# Patient Record
Sex: Female | Born: 1985 | Race: White | Hispanic: No | Marital: Married | Smoking: Former smoker
Health system: Southern US, Community
[De-identification: ages and names within clinical notes are randomized; demographics above are authoritative.]

## PROBLEM LIST (undated history)

## (undated) DIAGNOSIS — E119 Type 2 diabetes mellitus without complications: Secondary | ICD-10-CM

---

## 2006-09-25 ENCOUNTER — Ambulatory Visit (HOSPITAL_COMMUNITY): Admission: RE | Admit: 2006-09-25 | Discharge: 2006-09-25 | Payer: Self-pay | Admitting: Family Medicine

## 2009-04-24 ENCOUNTER — Encounter (HOSPITAL_COMMUNITY): Admission: RE | Admit: 2009-04-24 | Discharge: 2009-05-24 | Payer: Self-pay | Admitting: Pediatrics

## 2010-06-30 ENCOUNTER — Encounter: Payer: Self-pay | Admitting: Pediatrics

## 2016-09-24 ENCOUNTER — Ambulatory Visit: Payer: Self-pay | Admitting: "Endocrinology

## 2016-09-24 ENCOUNTER — Encounter: Payer: Self-pay | Admitting: "Endocrinology

## 2019-01-07 ENCOUNTER — Other Ambulatory Visit: Payer: Self-pay

## 2019-01-07 DIAGNOSIS — Z20822 Contact with and (suspected) exposure to covid-19: Secondary | ICD-10-CM

## 2019-01-09 LAB — NOVEL CORONAVIRUS, NAA: SARS-CoV-2, NAA: DETECTED — AB

## 2021-03-19 ENCOUNTER — Encounter: Payer: Self-pay | Admitting: Emergency Medicine

## 2021-03-19 ENCOUNTER — Other Ambulatory Visit: Payer: Self-pay

## 2021-03-19 ENCOUNTER — Ambulatory Visit (INDEPENDENT_AMBULATORY_CARE_PROVIDER_SITE_OTHER): Payer: Self-pay | Admitting: Emergency Medicine

## 2021-03-19 ENCOUNTER — Telehealth: Payer: Self-pay | Admitting: Emergency Medicine

## 2021-03-19 VITALS — BP 116/74 | HR 92 | Temp 98.4°F | Ht 65.0 in | Wt 228.4 lb

## 2021-03-19 DIAGNOSIS — R918 Other nonspecific abnormal finding of lung field: Secondary | ICD-10-CM

## 2021-03-19 DIAGNOSIS — K769 Liver disease, unspecified: Secondary | ICD-10-CM | POA: Insufficient documentation

## 2021-03-19 NOTE — Telephone Encounter (Signed)
Looks like it is actually at [facility], which works better for me

## 2021-03-19 NOTE — Progress Notes (Signed)
Subjective:    Patient ID: Katherine Washington, female    DOB: [birth year], 35 y.o.   MRN: [id number]  HPI 35 year old former smoker (3-4 pack years) with a history of diabetes.  She has been dealing with right-sided dental pain and left facial pain in mid September and was started on Augmentin for possible dental abscess.  She involves some dysphagia, difficulty managing her secretions and was seen in the ED.  CT neck and then CT scan of the chest were performed 9/19.  The CT scan of the neck was negative for mass or posterior pharyngeal obstruction.  She did have a large right jugular nodes, mucosal thickening along the floor the right maxillary sinus question odontogenic involving the first right molar.  Also partially revealed a left hilar soft tissue density which prompted the CT chest.  She has been well overall - has had some fatigue over last 6-8 months. Has had some trouble w breath control when singing. She does not cough, no CP. No sputum or hemoptysis. She noticed some chest tightness and anxiety since the lesion was found.   CT scan of the chest 02/25/2021 reviewed by me shows left hilar soft tissue mass that encases and obstructs the superior segmental left lower lobe airway.  There is some calcification in the lesion.  Also noted were multiple enhancing hepatic lesions.  Entire clinical picture concerning for possible malignancy.   Review of Systems As per HPI  PMH: Diabetes  No family history on file.  35 hx lung CA Paternal GF had CA, ? What kind  Social History   Socioeconomic History   Marital status: Married    Spouse name: Not on file   Number of children: Not on file   Years of education: Not on file   Highest education level: Not on file  Occupational History   Not on file  Tobacco Use   Smoking status: Never    Passive exposure: Never   Smokeless tobacco: Not on file  Substance and Sexual Activity   Alcohol use: Not on file   Drug use: Never   Sexual  activity: Not on file  Other Topics Concern   Not on file  Social History Narrative   Not on file   Social Determinants of Health   Financial Resource Strain: Not on file  Food Insecurity: Not on file  Transportation Needs: Not on file  Physical Activity: Not on file  Stress: Not on file  Social Connections: Not on file  Intimate Partner Violence: Not on file    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  [place] native   Not on File   Outpatient Medications Prior to Visit  Medication Sig Dispense Refill   metFORMIN (GLUCOPHAGE) 500 MG tablet Take 500 mg by mouth 2 (two) times daily with a meal.     No facility-administered medications prior to visit.         Objective:   Physical Exam Vitals:   03/19/21 1535  BP: 116/74  Pulse: 92  Temp: 98.4 F (36.9 C)  TempSrc: Oral  SpO2: 99%  Weight: 228 lb 6.4 oz (103.6 kg)  Height: 5\' 5"  (1.651 m)   Gen: Pleasant, well-nourished, in no distress,  normal affect  ENT: No lesions,  mouth clear,  oropharynx clear, no postnasal drip  Neck: No JVD, no stridor  Lungs: No use of accessory muscles, no crackles or wheezing on normal respiration, no wheeze on forced expiration  Cardiovascular: RRR, heart  sounds normal, no murmur or gallops, no peripheral edema  Musculoskeletal: No deformities, no cyanosis or clubbing  Neuro: alert, awake, non focal  Skin: Warm, no lesions or rash     Assessment & Plan:  Hilar mass Left hilar mass impacting the suprasegmental airway of the left lower lobe and encasing left lower lobe bronchus.  There is some associated calcium which is reassuring, would be consistent with hopefully benign process.  Differential diagnosis here is broad -could include benign lung tumor, carcinoid, lymphoma.  Less likely primary lung cancer given its appearance and her age but possible..  I think she needs a tissue diagnosis and have recommended bronchoscopy with endobronchial ultrasound and biopsies.  She  agrees.  I will try to get this arranged for next week.  Liver lesion Enhancing lesions in the liver on the CT scan of the chest, etiology unclear.  Need to be better defined by MRI of the liver and I will arrange for this.   Baltazar Apo, MD, PhD 03/19/2021, 5:27 PM [facility] [phone] or if no answer before 7:00PM call [phone] For any issues after 7:00PM please call eLink [phone]

## 2021-03-19 NOTE — Assessment & Plan Note (Signed)
Left hilar mass impacting the suprasegmental airway of the left lower lobe and encasing left lower lobe bronchus.  There is some associated calcium which is reassuring, would be consistent with hopefully benign process.  Differential diagnosis here is broad -could include benign lung tumor, carcinoid, lymphoma.  Less likely primary lung cancer given its appearance and her age but possible..  I think she needs a tissue diagnosis and have recommended bronchoscopy with endobronchial ultrasound and biopsies.  She agrees.  I will try to get this arranged for next week.

## 2021-03-19 NOTE — Telephone Encounter (Signed)
I scheduled pt for 10/18 at 10:45 at [facility].  MRI has been scheduled for 10/13 at 9:00.  Pt will go for covid test on 10/14.  I gave all appt info to pt.

## 2021-03-19 NOTE — Assessment & Plan Note (Signed)
Enhancing lesions in the liver on the CT scan of the chest, etiology unclear.  Need to be better defined by MRI of the liver and I will arrange for this.

## 2021-03-19 NOTE — H&P (View-Only) (Signed)
Subjective:    Patient ID: Katherine Washington, female    DOB: 11-11-1985, 35 y.o.   MRN: [id number]  HPI 35 year old former smoker (3-4 pack years) with a history of diabetes.  She has been dealing with right-sided dental pain and left facial pain in mid September and was started on Augmentin for possible dental abscess.  She involves some dysphagia, difficulty managing her secretions and was seen in the ED.  CT neck and then CT scan of the chest were performed 9/19.  The CT scan of the neck was negative for mass or posterior pharyngeal obstruction.  She did have a large right jugular nodes, mucosal thickening along the floor the right maxillary sinus question odontogenic involving the first right molar.  Also partially revealed a left hilar soft tissue density which prompted the CT chest.  She has been well overall - has had some fatigue over last 6-8 months. Has had some trouble w breath control when singing. She does not cough, no CP. No sputum or hemoptysis. She noticed some chest tightness and anxiety since the lesion was found.   CT scan of the chest 02/25/2021 reviewed by me shows left hilar soft tissue mass that encases and obstructs the superior segmental left lower lobe airway.  There is some calcification in the lesion.  Also noted were multiple enhancing hepatic lesions.  Entire clinical picture concerning for possible malignancy.   Review of Systems As per HPI  PMH: Diabetes  No family history on file.  No hx lung CA Paternal GF had CA, ? What kind  Social History   Socioeconomic History   Marital status: Married    Spouse name: Not on file   Number of children: Not on file   Years of education: Not on file   Highest education level: Not on file  Occupational History   Not on file  Tobacco Use   Smoking status: Never    Passive exposure: Never   Smokeless tobacco: Not on file  Substance and Sexual Activity   Alcohol use: Not on file   Drug use: Never   Sexual  activity: Not on file  Other Topics Concern   Not on file  Social History Narrative   Not on file   Social Determinants of Health   Financial Resource Strain: Not on file  Food Insecurity: Not on file  Transportation Needs: Not on file  Physical Activity: Not on file  Stress: Not on file  Social Connections: Not on file  Intimate Partner Violence: Not on file    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  [place] native   Not on File   Outpatient Medications Prior to Visit  Medication Sig Dispense Refill   metFORMIN (GLUCOPHAGE) 500 MG tablet Take 500 mg by mouth 2 (two) times daily with a meal.     No facility-administered medications prior to visit.         Objective:   Physical Exam Vitals:   03/19/21 1535  BP: 116/74  Pulse: 92  Temp: 98.4 F (36.9 C)  TempSrc: Oral  SpO2: 99%  Weight: 228 lb 6.4 oz (103.6 kg)  Height: 5\' 5"  (1.651 m)   Gen: Pleasant, well-nourished, in no distress,  normal affect  ENT: No lesions,  mouth clear,  oropharynx clear, no postnasal drip  Neck: No JVD, no stridor  Lungs: No use of accessory muscles, no crackles or wheezing on normal respiration, no wheeze on forced expiration  Cardiovascular: RRR, heart  sounds normal, no murmur or gallops, no peripheral edema  Musculoskeletal: No deformities, no cyanosis or clubbing  Neuro: alert, awake, non focal  Skin: Warm, no lesions or rash     Assessment & Plan:  Hilar mass Left hilar mass impacting the suprasegmental airway of the left lower lobe and encasing left lower lobe bronchus.  There is some associated calcium which is reassuring, would be consistent with hopefully benign process.  Differential diagnosis here is broad -could include benign lung tumor, carcinoid, lymphoma.  Less likely primary lung cancer given its appearance and her age but possible..  I think she needs a tissue diagnosis and have recommended bronchoscopy with endobronchial ultrasound and biopsies.  She  agrees.  I will try to get this arranged for next week.  Liver lesion Enhancing lesions in the liver on the CT scan of the chest, etiology unclear.  Need to be better defined by MRI of the liver and I will arrange for this.   Baltazar Apo, MD, PhD 03/19/2021, 5:27 PM [facility] [phone] or if no answer before 7:00PM call [phone] For any issues after 7:00PM please call eLink [phone]

## 2021-03-19 NOTE — Patient Instructions (Addendum)
We will work on setting up a bronchoscopy with endobronchial ultrasound to evaluate your left lung soft tissue density.  This will be done under general anesthesia as an outpatient.  We will try to get this set up at [facility] sometime next week. We will arrange for an MRI of the abdomen Follow with Dr Lamonte Sakai in 1 month

## 2021-03-21 ENCOUNTER — Encounter (HOSPITAL_COMMUNITY): Payer: Self-pay | Admitting: Radiology

## 2021-03-21 ENCOUNTER — Ambulatory Visit (HOSPITAL_COMMUNITY)
Admission: RE | Admit: 2021-03-21 | Discharge: 2021-03-21 | Disposition: A | Discharge status: Home / Self Care | Payer: Medicaid Other | Source: Ambulatory Visit | Attending: Emergency Medicine | Admitting: Emergency Medicine

## 2021-03-21 DIAGNOSIS — K769 Liver disease, unspecified: Secondary | ICD-10-CM | POA: Insufficient documentation

## 2021-03-21 MED ORDER — GADOBUTROL 1 MMOL/ML IV SOLN
10.0000 mL | Freq: Once | INTRAVENOUS | Status: AC | PRN
  Administered 2021-03-21: 10 mL via INTRAVENOUS

## 2021-03-22 ENCOUNTER — Other Ambulatory Visit: Payer: Self-pay | Admitting: Emergency Medicine

## 2021-03-22 NOTE — Telephone Encounter (Signed)
Called the pt to make sure she understands to go to [facility] and not [facility] for her procedure. Line rang several times and then someone picked up and hung up. Will call back.

## 2021-03-23 LAB — SARS CORONAVIRUS 2 (TAT 6-24 HRS): SARS Coronavirus 2: NEGATIVE

## 2021-03-26 ENCOUNTER — Encounter (HOSPITAL_COMMUNITY)
Admission: RE | Disposition: A | Discharge status: Home / Self Care | Payer: Self-pay | Source: Home / Self Care | Attending: Emergency Medicine

## 2021-03-26 ENCOUNTER — Other Ambulatory Visit: Payer: Self-pay

## 2021-03-26 ENCOUNTER — Ambulatory Visit (HOSPITAL_COMMUNITY): Payer: Self-pay | Admitting: Certified Registered"

## 2021-03-26 ENCOUNTER — Ambulatory Visit (HOSPITAL_COMMUNITY)
Admission: RE | Admit: 2021-03-26 | Discharge: 2021-03-26 | Disposition: A | Discharge status: Home / Self Care | Payer: Self-pay | Attending: Emergency Medicine | Admitting: Emergency Medicine

## 2021-03-26 ENCOUNTER — Encounter (HOSPITAL_COMMUNITY): Payer: Self-pay | Admitting: Emergency Medicine

## 2021-03-26 DIAGNOSIS — R918 Other nonspecific abnormal finding of lung field: Secondary | ICD-10-CM

## 2021-03-26 DIAGNOSIS — E119 Type 2 diabetes mellitus without complications: Secondary | ICD-10-CM | POA: Insufficient documentation

## 2021-03-26 DIAGNOSIS — D381 Neoplasm of uncertain behavior of trachea, bronchus and lung: Secondary | ICD-10-CM | POA: Insufficient documentation

## 2021-03-26 DIAGNOSIS — K769 Liver disease, unspecified: Secondary | ICD-10-CM | POA: Insufficient documentation

## 2021-03-26 DIAGNOSIS — Z87891 Personal history of nicotine dependence: Secondary | ICD-10-CM | POA: Insufficient documentation

## 2021-03-26 DIAGNOSIS — Z6838 Body mass index (BMI) 38.0-38.9, adult: Secondary | ICD-10-CM | POA: Insufficient documentation

## 2021-03-26 DIAGNOSIS — Z7984 Long term (current) use of oral hypoglycemic drugs: Secondary | ICD-10-CM | POA: Insufficient documentation

## 2021-03-26 HISTORY — PX: HEMOSTASIS CONTROL: SHX6838

## 2021-03-26 HISTORY — PX: BRONCHIAL WASHINGS: SHX5105

## 2021-03-26 HISTORY — DX: Type 2 diabetes mellitus without complications: E11.9

## 2021-03-26 HISTORY — PX: BRONCHIAL NEEDLE ASPIRATION BIOPSY: SHX5106

## 2021-03-26 HISTORY — PX: BRONCHIAL BIOPSY: SHX5109

## 2021-03-26 HISTORY — PX: ENDOBRONCHIAL ULTRASOUND: SHX5096

## 2021-03-26 HISTORY — PX: BRONCHIAL BRUSHINGS: SHX5108

## 2021-03-26 HISTORY — PX: VIDEO BRONCHOSCOPY: SHX5072

## 2021-03-26 LAB — GLUCOSE, CAPILLARY: Glucose-Capillary: 169 mg/dL — ABNORMAL HIGH (ref 70–99)

## 2021-03-26 SURGERY — VIDEO BRONCHOSCOPY WITHOUT FLUORO
Anesthesia: General

## 2021-03-26 MED ORDER — LIDOCAINE 2% (20 MG/ML) 5 ML SYRINGE
INTRAMUSCULAR | Status: DC | PRN
  Administered 2021-03-26: 60 mg via INTRAVENOUS

## 2021-03-26 MED ORDER — FENTANYL CITRATE (PF) 100 MCG/2ML IJ SOLN
INTRAMUSCULAR | Status: DC | PRN
  Administered 2021-03-26 (×2): 50 ug via INTRAVENOUS

## 2021-03-26 MED ORDER — PROPOFOL 10 MG/ML IV BOLUS
INTRAVENOUS | Status: DC | PRN
  Administered 2021-03-26: 200 mg via INTRAVENOUS

## 2021-03-26 MED ORDER — SUGAMMADEX SODIUM 200 MG/2ML IV SOLN
INTRAVENOUS | Status: DC | PRN
  Administered 2021-03-26: 200 mg via INTRAVENOUS

## 2021-03-26 MED ORDER — ROCURONIUM BROMIDE 10 MG/ML (PF) SYRINGE
PREFILLED_SYRINGE | INTRAVENOUS | Status: DC | PRN
  Administered 2021-03-26: 10 mg via INTRAVENOUS
  Administered 2021-03-26: 50 mg via INTRAVENOUS
  Administered 2021-03-26: 10 mg via INTRAVENOUS

## 2021-03-26 MED ORDER — LACTATED RINGERS IV SOLN: INTRAVENOUS | Status: DC | PRN

## 2021-03-26 MED ORDER — ONDANSETRON HCL 4 MG/2ML IJ SOLN
INTRAMUSCULAR | Status: DC | PRN
  Administered 2021-03-26: 4 mg via INTRAVENOUS

## 2021-03-26 MED ORDER — FENTANYL CITRATE (PF) 100 MCG/2ML IJ SOLN
INTRAMUSCULAR | Status: AC
  Filled 2021-03-26: qty 2

## 2021-03-26 MED ORDER — DEXAMETHASONE SODIUM PHOSPHATE 10 MG/ML IJ SOLN
INTRAMUSCULAR | Status: DC | PRN
  Administered 2021-03-26: 4 mg via INTRAVENOUS

## 2021-03-26 NOTE — Op Note (Signed)
Video Bronchoscopy with Endobronchial Ultrasound Procedure Note  Date of Operation: 03/26/2021  Pre-op Diagnosis: Left hilar mass  Post-op Diagnosis: Same  Surgeon: Baltazar Apo  Assistants: None  Anesthesia: General endotracheal anesthesia  Operation: Flexible video fiberoptic bronchoscopy with endobronchial ultrasound and biopsies.  Estimated Blood Loss: Minimal  Complications: None apparent  Indications and History: Katherine Washington is a 35 y.o. female with little past medical history.  She was found to have  a left hilar mass on CT scan of the chest that was found spuriously.  Recommendation was made to achieve a tissue diagnosis of the bronchoscopy with endobronchial ultrasound and biopsies.  The risks, benefits, complications, treatment options and expected outcomes were discussed with the patient.  The possibilities of pneumothorax, pneumonia, reaction to medication, pulmonary aspiration, perforation of a viscus, bleeding, failure to diagnose a condition and creating a complication requiring transfusion or operation were discussed with the patient who freely signed the consent.    Description of Procedure: The patient was examined in the preoperative area and history and data from the preprocedure consultation were reviewed. It was deemed appropriate to proceed.  The patient was taken to [facility] endoscopy room 2, identified as [name] and the procedure verified as Flexible Video Fiberoptic Bronchoscopy.  A Time Out was held and the above information confirmed. After being taken to the operating room general anesthesia was initiated and the patient  was orally intubated. The video fiberoptic bronchoscope was introduced via the endotracheal tube and a general inspection was performed which showed normal airways on the right.  The left upper lobe airways were widely patent.  A bronchoalveolar lavage was performed in the left upper lobe with 60 cc normal saline instilled,  approximately 30 cc returned.  This was sent for microbiology.  The superior segment of the left lower lobe was fishmouthed.  The scope would not pass but instruments would pass through the lumen of the segmental airway.  Endobronchial brushings and endobronchial forceps biopsies were performed in the superior segmental airway on the left.  The other left lower lobe airways were widely patent.  The standard scope was then withdrawn and the endobronchial ultrasound was used to identify and characterize the peritracheal, hilar and bronchial lymph nodes. Inspection showed no enlarged nodes.  There was a large mass lesion in the left hilum in the region where we would usually find station 11 L and 12 L nodes. Using real-time ultrasound guidance Wang needle biopsies were take from the left hilar mass and were sent for cytology. The patient tolerated the procedure well without apparent complications. There was no significant blood loss. The bronchoscope was withdrawn. Anesthesia was reversed and the patient was taken to recovery area.  Samples: 1. Wang needle biopsies from left hilar mass 2.  Bronchoalveolar lavage from the left upper lobe 3.  Endobronchial brushings from the left lower lobe superior segment airway 4.  Endobronchial biopsies from the left lower lobe superior segment airway  Plans:  The patient will be discharged from endoscopy to home when recovered from anesthesia. We will review the cytology, pathology and microbiology results with the patient when they become available. Outpatient followup will be with Dr. Lamonte Sakai.    Baltazar Apo, MD, PhD 03/26/2021, 12:11 PM [place] [facility] [phone] or if no answer before 7:00PM call [phone] For any issues after 7:00PM please call eLink [phone]

## 2021-03-26 NOTE — Anesthesia Procedure Notes (Signed)
Procedure Name: Intubation Date/Time: 03/26/2021 10:51 AM Performed by: Niel Hummer, CRNA Pre-anesthesia Checklist: Patient identified, Emergency Drugs available, Suction available and Patient being monitored Patient Re-evaluated:Patient Re-evaluated prior to induction Oxygen Delivery Method: Circle system utilized Preoxygenation: Pre-oxygenation with 100% oxygen Induction Type: IV induction Ventilation: Mask ventilation without difficulty Laryngoscope Size: Mac and 4 Grade View: Grade I Tube type: Oral Tube size: 8.5 mm Number of attempts: 1 Airway Equipment and Method: Stylet Placement Confirmation: ETT inserted through vocal cords under direct vision, positive ETCO2 and breath sounds checked- equal and bilateral Secured at: 23 cm Tube secured with: Tape Dental Injury: Teeth and Oropharynx as per pre-operative assessment

## 2021-03-26 NOTE — Discharge Instructions (Signed)
Flexible Bronchoscopy, Care After This sheet gives you information about how to care for yourself after your test. Your doctor may also give you more specific instructions. If you have problems or questions, contact your doctor. Follow these instructions at home: Eating and drinking Do not eat or drink anything (not even water) for 2 hours after your test, or until your numbing medicine (local anesthetic) wears off. When your numbness is gone and your cough and gag reflexes have come back, you may: Eat only soft foods. Slowly drink liquids. The day after the test, go back to your normal diet. Driving Do not drive for 24 hours if you were given a medicine to help you relax (sedative). Do not drive or use heavy machinery while taking prescription pain medicine. General instructions  Take over-the-counter and prescription medicines only as told by your doctor. Return to your normal activities as told. Ask what activities are safe for you. Do not use any products that have nicotine or tobacco in them. This includes cigarettes and e-cigarettes. If you need help quitting, ask your doctor. Keep all follow-up visits as told by your doctor. This is important. It is very important if you had a tissue sample (biopsy) taken. Get help right away if: You have shortness of breath that gets worse. You get light-headed. You feel like you are going to pass out (faint). You have chest pain. You cough up: More than a little blood. More blood than before. Summary Do not eat or drink anything (not even water) for 2 hours after your test, or until your numbing medicine wears off. Do not use cigarettes. Do not use e-cigarettes. Get help right away if you have chest pain.  Please call our office for any questions or concerns.  [phone].  This information is not intended to replace advice given to you by your health care provider. Make sure you discuss any questions you have with your health care  provider. Document Released: 03/23/2009 Document Revised: 05/08/2017 Document Reviewed: 06/13/2016 Elsevier Patient Education  2020 [employer].

## 2021-03-26 NOTE — Interval H&P Note (Signed)
History and Physical Interval Note:  03/26/2021 10:32 AM  Katherine Washington  has presented today for surgery, with the diagnosis of hepatic lesion/left hilar mass.  The various methods of treatment have been discussed with the patient and family. After consideration of risks, benefits and other options for treatment, the patient has consented to  Procedure(s): VIDEO BRONCHOSCOPY WITHOUT FLUORO (N/A) ENDOBRONCHIAL ULTRASOUND (Bilateral) as a surgical intervention.  The patient's history has been reviewed, patient examined, no change in status, stable for surgery.  I have reviewed the patient's chart and labs.  Questions were answered to the patient's satisfaction.     [name]

## 2021-03-26 NOTE — Anesthesia Preprocedure Evaluation (Signed)
Anesthesia Evaluation  Patient identified by MRN, date of birth, ID band Patient awake    Reviewed: Allergy & Precautions, H&P , NPO status , Patient's Chart, lab work & pertinent test results  Airway Mallampati: II  TM Distance: >3 FB Neck ROM: Full    Dental no notable dental hx. (+) Teeth Intact, Dental Advisory Given   Pulmonary neg pulmonary ROS,    Pulmonary exam normal breath sounds clear to auscultation       Cardiovascular negative cardio ROS   Rhythm:Regular Rate:Normal     Neuro/Psych negative neurological ROS  negative psych ROS   GI/Hepatic negative GI ROS, Neg liver ROS,   Endo/Other  diabetes, Type 2, Oral Hypoglycemic AgentsMorbid obesity  Renal/GU negative Renal ROS  negative genitourinary   Musculoskeletal   Abdominal   Peds  Hematology negative hematology ROS (+)   Anesthesia Other Findings   Reproductive/Obstetrics negative OB ROS                             Anesthesia Physical Anesthesia Plan  ASA: 3  Anesthesia Plan: General   Post-op Pain Management:    Induction: Intravenous  PONV Risk Score and Plan: 4 or greater and Ondansetron, Dexamethasone and Midazolam  Airway Management Planned: Oral ETT  Additional Equipment:   Intra-op Plan:   Post-operative Plan: Extubation in OR  Informed Consent: I have reviewed the patients History and Physical, chart, labs and discussed the procedure including the risks, benefits and alternatives for the proposed anesthesia with the patient or authorized representative who has indicated his/her understanding and acceptance.     Dental advisory given  Plan Discussed with: CRNA  Anesthesia Plan Comments:         Anesthesia Quick Evaluation

## 2021-03-26 NOTE — Progress Notes (Signed)
Per Dr Lamonte Sakai no xray needed before d/c. Went over d/c instructions with husband

## 2021-03-26 NOTE — Transfer of Care (Signed)
Immediate Anesthesia Transfer of Care Note  Patient: Katherine Washington  Procedure(s) Performed: VIDEO BRONCHOSCOPY WITHOUT FLUORO ENDOBRONCHIAL ULTRASOUND (Bilateral) BRONCHIAL WASHINGS BRONCHIAL BRUSHINGS HEMOSTASIS CONTROL BRONCHIAL BIOPSIES BRONCHIAL NEEDLE ASPIRATION BIOPSIES  Patient Location: PACU  Anesthesia Type:General  Level of Consciousness: awake, alert  and oriented  Airway & Oxygen Therapy: Patient Spontanous Breathing and Patient connected to face mask oxygen  Post-op Assessment: Report given to RN, Post -op Vital signs reviewed and stable and Patient moving all extremities X 4  Post vital signs: Reviewed and stable  Last Vitals:  Vitals Value Taken Time  BP 135/84 03/26/21 1212  Temp    Pulse 93 03/26/21 1213  Resp 27 03/26/21 1213  SpO2 100 % 03/26/21 1213  Vitals shown include unvalidated device data.  Last Pain:  Vitals:   03/26/21 0957  TempSrc: Oral  PainSc: 0-No pain         Complications: No notable events documented.

## 2021-03-26 NOTE — Anesthesia Postprocedure Evaluation (Signed)
Anesthesia Post Note  Patient: [name]  Procedure(s) Performed: VIDEO BRONCHOSCOPY WITHOUT FLUORO ENDOBRONCHIAL ULTRASOUND (Bilateral) BRONCHIAL WASHINGS BRONCHIAL BRUSHINGS HEMOSTASIS CONTROL BRONCHIAL BIOPSIES BRONCHIAL NEEDLE ASPIRATION BIOPSIES     Patient location during evaluation: PACU Anesthesia Type: General Level of consciousness: awake and alert Pain management: pain level controlled Vital Signs Assessment: post-procedure vital signs reviewed and stable Respiratory status: spontaneous breathing, nonlabored ventilation and respiratory function stable Cardiovascular status: blood pressure returned to baseline and stable Postop Assessment: no apparent nausea or vomiting Anesthetic complications: no   No notable events documented.  Last Vitals:  Vitals:   03/26/21 1230 03/26/21 1240  BP: 126/79 132/80  Pulse: 95 92  Resp: 14 12  Temp:    SpO2: 97% 97%    Last Pain:  Vitals:   03/26/21 1240  TempSrc:   PainSc: 0-No pain                 Katherine Washington,W. EDMOND

## 2021-03-28 LAB — CULTURE, RESPIRATORY W GRAM STAIN: Culture: NORMAL

## 2021-03-29 ENCOUNTER — Telehealth: Payer: Self-pay | Admitting: Emergency Medicine

## 2021-03-29 DIAGNOSIS — K769 Liver disease, unspecified: Secondary | ICD-10-CM

## 2021-03-29 LAB — ANAEROBIC CULTURE W GRAM STAIN

## 2021-03-29 LAB — CYTOLOGY - NON PAP

## 2021-03-29 LAB — SURGICAL PATHOLOGY

## 2021-03-29 NOTE — Telephone Encounter (Signed)
Routing message to Phelps as she is provider of the day and Dr. Lamonte Sakai is listed as off today in [place]. Also sending to Dr. Lamonte Sakai as well.

## 2021-03-29 NOTE — Telephone Encounter (Addendum)
Hx Hilar mass. Patient underwent bronchoscopy on 03/26/21 with Dr. Lamonte Sakai . Spoke with Dr. 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   She had MR liver on 03/22/21 that showed Innumerable arterially hyperenhancing lesions of varying sizes throughout the liver, consistent with hepatic metastatic disease.  I have sent a staff message to [facility] to have patient set up with [facility]. She will likely need to be set up for liver bx as well, I will wait to hear from Dr. Lamonte Sakai   Patient has not been called yet with results

## 2021-03-29 NOTE — Telephone Encounter (Signed)
Spoke with the patient - she is doing well post-FOB. Still no path results back yet. I will call when we have that data,

## 2021-03-29 NOTE — Telephone Encounter (Addendum)
1) Attempted to call patient to review pathology results, there was no answer and was not able to leave message. If patient calls back today, Friday 03/29/21, please let me know. Otherwise Dr. Lamonte Sakai can call patient next week.   2) Holding off on [place], she will be discuss at thoracic conference next Thursday   2) Mandi, patient needs to be set up for liver BX with IR

## 2021-04-01 MED ORDER — DOXYCYCLINE MONOHYDRATE 100 MG PO TABS: 100.0000 mg | ORAL_TABLET | Freq: Two times a day (BID) | ORAL | 0 refills | Status: DC

## 2021-04-01 NOTE — Telephone Encounter (Signed)
I took care of the labs.  Needs to be scheduled with IR. Depending on the radiologist's evaluation of the films, they may decide to change the order from US-guided to CT-guided. Will have to work with them on this.

## 2021-04-01 NOTE — Telephone Encounter (Signed)
I see that labs were ordered on 03/26/21 and this is a hard stop on the order. Do you want any additional labs on this patient for the order? Also is this the correct order for the lesion of the liver? Please advise.

## 2021-04-01 NOTE — Telephone Encounter (Signed)
Reviewed pathology results with patient by phone this morning.  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  Hopefully this reflects local disease.  I did explain to her that is possible for carcinoid to spread.  We need to correlate these results with her hepatic lesions.  We will work on setting her up for liver lesion biopsy and interventional radiology.  She has been coughing up some yellow sputum, no blood for the last 2 days.  May have had some chills, no documented fever.  Symptoms consistent with possible evolving bronchitis post procedure.  I will treat her with course of doxycycline.  I sent prescription to her pharmacy this morning.  Please ensure/order needle biopsy of liver lesion in interventional radiology if not already ordered.

## 2021-04-02 LAB — ACID FAST SMEAR (AFB, MYCOBACTERIA): Acid Fast Smear: NEGATIVE

## 2021-04-03 ENCOUNTER — Encounter (HOSPITAL_COMMUNITY): Payer: Self-pay

## 2021-04-03 NOTE — Progress Notes (Signed)
Lupe, Handley D Legal Sex  Female DOB  [birth year] SSN  [id number] Address  [place] [address] Phone  [phone] [facility])  [phone] (Mobile)    RE: US Liver Biopsy Received: Today Arne Cleveland, MD  Sherlyn Lees   [place] core liver lesion   [facility]        Previous Messages   ----- Message -----  From: Valli Glance  Sent: 04/02/2021  12:47 PM EDT  To: Ir Procedure Requests  Subject: US Liver Biopsy                                 US Liver Biopsy    Reason: Hepatic Lesions, r/o malignancy     History: MRI in computer     Provider: [name]     Contact: [phone]

## 2021-04-03 NOTE — Telephone Encounter (Signed)
Called the pt and the line rings and then turns busy- Will try back  PCC's can you tell us when pt should hear about this being scheduled?

## 2021-04-04 ENCOUNTER — Telehealth: Payer: Self-pay | Admitting: Physician Assistant

## 2021-04-04 ENCOUNTER — Encounter: Payer: Self-pay | Admitting: *Deleted

## 2021-04-04 ENCOUNTER — Telehealth: Payer: Self-pay | Admitting: Emergency Medicine

## 2021-04-04 ENCOUNTER — Telehealth: Payer: Self-pay | Admitting: *Deleted

## 2021-04-04 DIAGNOSIS — R918 Other nonspecific abnormal finding of lung field: Secondary | ICD-10-CM

## 2021-04-04 NOTE — Telephone Encounter (Signed)
Scheduled appt per 10/27 referral. Pt is aware of appt date and time.  

## 2021-04-04 NOTE — Telephone Encounter (Signed)
Pt is scheduled 04/11/21 IR calls these patients looks like it was made after 6 yesterday

## 2021-04-04 NOTE — Progress Notes (Signed)
I received referral on [facility] today. I updated new patient coordinator to call and schedule patient to be seen on 11/9 with Cassie.

## 2021-04-04 NOTE — Telephone Encounter (Signed)
Discussed the patient's studies at thoracic conference this morning.  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  She has a liver biopsy scheduled for next Thursday 11/3 and interventional radiology.  I will order PET scan, will make the referral to [facility] now.   Reviewed our discussions and plans with her today.

## 2021-04-05 ENCOUNTER — Other Ambulatory Visit: Payer: Self-pay | Admitting: *Deleted

## 2021-04-05 NOTE — Progress Notes (Signed)
The proposed treatment discussed in conference is for discussion purpose only and is not a binding recommendation. The patient was not been physically examined, or presented with their treatment options. Therefore, final treatment plans cannot be decided.  

## 2021-04-10 ENCOUNTER — Other Ambulatory Visit: Payer: Self-pay | Admitting: Internal Medicine

## 2021-04-10 ENCOUNTER — Other Ambulatory Visit: Payer: Self-pay | Admitting: Radiology

## 2021-04-11 ENCOUNTER — Encounter (HOSPITAL_COMMUNITY): Payer: Self-pay

## 2021-04-11 ENCOUNTER — Ambulatory Visit (HOSPITAL_COMMUNITY)
Admission: RE | Admit: 2021-04-11 | Discharge: 2021-04-11 | Disposition: A | Discharge status: Home / Self Care | Payer: Medicaid Other | Source: Ambulatory Visit | Attending: Emergency Medicine | Admitting: Emergency Medicine

## 2021-04-11 ENCOUNTER — Other Ambulatory Visit: Payer: Self-pay

## 2021-04-11 DIAGNOSIS — Z7984 Long term (current) use of oral hypoglycemic drugs: Secondary | ICD-10-CM | POA: Insufficient documentation

## 2021-04-11 DIAGNOSIS — K760 Fatty (change of) liver, not elsewhere classified: Secondary | ICD-10-CM | POA: Insufficient documentation

## 2021-04-11 DIAGNOSIS — C787 Secondary malignant neoplasm of liver and intrahepatic bile duct: Secondary | ICD-10-CM | POA: Insufficient documentation

## 2021-04-11 DIAGNOSIS — E119 Type 2 diabetes mellitus without complications: Secondary | ICD-10-CM | POA: Insufficient documentation

## 2021-04-11 DIAGNOSIS — D3A8 Other benign neuroendocrine tumors: Secondary | ICD-10-CM | POA: Insufficient documentation

## 2021-04-11 DIAGNOSIS — K769 Liver disease, unspecified: Secondary | ICD-10-CM

## 2021-04-11 LAB — CBC
HCT: 40.3 % (ref 36.0–46.0)
Hemoglobin: 14.2 g/dL (ref 12.0–15.0)
MCH: 28.5 pg (ref 26.0–34.0)
MCHC: 35.2 g/dL (ref 30.0–36.0)
MCV: 80.9 fL (ref 80.0–100.0)
Platelets: 470 10*3/uL — ABNORMAL HIGH (ref 150–400)
RBC: 4.98 MIL/uL (ref 3.87–5.11)
RDW: 12.8 % (ref 11.5–15.5)
WBC: 11.9 10*3/uL — ABNORMAL HIGH (ref 4.0–10.5)
nRBC: 0 % (ref 0.0–0.2)

## 2021-04-11 LAB — PROTIME-INR
INR: 1 (ref 0.8–1.2)
Prothrombin Time: 13.3 seconds (ref 11.4–15.2)

## 2021-04-11 LAB — GLUCOSE, CAPILLARY: Glucose-Capillary: 240 mg/dL — ABNORMAL HIGH (ref 70–99)

## 2021-04-11 MED ORDER — FENTANYL CITRATE (PF) 100 MCG/2ML IJ SOLN
INTRAMUSCULAR | Status: AC
  Filled 2021-04-11: qty 2

## 2021-04-11 MED ORDER — MIDAZOLAM HCL 2 MG/2ML IJ SOLN
INTRAMUSCULAR | Status: AC | PRN
  Administered 2021-04-11 (×2): 1 mg via INTRAVENOUS

## 2021-04-11 MED ORDER — GELATIN ABSORBABLE 12-7 MM EX MISC
CUTANEOUS | Status: AC
  Filled 2021-04-11: qty 1

## 2021-04-11 MED ORDER — SODIUM CHLORIDE 0.9 % IV SOLN: INTRAVENOUS | Status: DC

## 2021-04-11 MED ORDER — SODIUM CHLORIDE 0.9 % IV SOLN
INTRAVENOUS | Status: AC | PRN
  Administered 2021-04-11: 10 mL/h via INTRAVENOUS

## 2021-04-11 MED ORDER — MIDAZOLAM HCL 2 MG/2ML IJ SOLN
INTRAMUSCULAR | Status: AC
  Filled 2021-04-11: qty 2

## 2021-04-11 MED ORDER — FENTANYL CITRATE (PF) 100 MCG/2ML IJ SOLN
INTRAMUSCULAR | Status: AC | PRN
  Administered 2021-04-11: 25 ug via INTRAVENOUS
  Administered 2021-04-11: 50 ug via INTRAVENOUS

## 2021-04-11 MED ORDER — LIDOCAINE HCL (PF) 1 % IJ SOLN
INTRAMUSCULAR | Status: AC
  Filled 2021-04-11: qty 30

## 2021-04-11 NOTE — Progress Notes (Signed)
Discharge instructions reviewed with pt and her husband. Both voice understanding.  

## 2021-04-11 NOTE — Procedures (Signed)
Interventional Radiology Procedure Note  Procedure: US guided bx of liver lesion.  Complications: None  Estimated Blood Loss: None  Recommendations: - bedrest x 2 hrs - DC home   Signed,  Criselda Peaches, MD

## 2021-04-11 NOTE — H&P (Signed)
Chief Complaint: Patient was seen in consultation today for liver lesion biopsy  at the request of Laurie S  Referring Physician(s): [place] Physician: Jacqulynn Cadet  Patient Status: [facility] - Out-pt  History of Present Illness: [name] is a 35 y.o. female with PMH of diabetes and tobacco abuse.  Patient presented to ED on 02/25/2021 complaining of dysphagia and difficulty managing secretions.  CT chest and neck done at that time resulted with left hilar mass and multiple hepatic lesions.  Patient underwent bronchoscopy on 03/26/2021 that resulted with low-grade neoplasm with neuroendocrine differentiation. Patient had MRI 03/22/2021 that resulted with hepatic lesions of varying size consistent with metastatic disease.  Dr. Court Joy has referred patient to IR today for liver lesion biopsy to rule out malignancy.  He is also requesting cytology, gram stain, culture fungus without smear.  Patient has PET scan scheduled 04/15/2021.  MR liver 03/22/2021: IMPRESSION: 1. Innumerable arterially hyperenhancing lesions of varying sizes throughout the liver, consistent with hepatic metastatic disease.   2. No lymphadenopathy or other evidence of metastatic disease in the abdomen.   3. Left hilar mass is partially imaged and better assessed by prior CT of the chest.  Past Medical History:  Diagnosis Date   Diabetes mellitus without complication [facility])     Past Surgical History:  Procedure Laterality Date   BRONCHIAL BIOPSY  03/26/2021   Procedure: BRONCHIAL BIOPSIES;  Surgeon: [name], MD;  Location: [facility] ENDOSCOPY;  Service: Cardiopulmonary;;   BRONCHIAL BRUSHINGS  03/26/2021   Procedure: BRONCHIAL BRUSHINGS;  Surgeon: [name], MD;  Location: [facility] ENDOSCOPY;  Service: Cardiopulmonary;;   BRONCHIAL NEEDLE ASPIRATION BIOPSY  03/26/2021   Procedure: BRONCHIAL NEEDLE ASPIRATION BIOPSIES;  Surgeon: [name], MD;  Location: [facility];  Service: Cardiopulmonary;;   BRONCHIAL WASHINGS  03/26/2021   Procedure: BRONCHIAL WASHINGS;  Surgeon: [name], MD;  Location: [facility];  Service: Cardiopulmonary;;   ENDOBRONCHIAL ULTRASOUND Bilateral 03/26/2021   Procedure: ENDOBRONCHIAL ULTRASOUND;  Surgeon: [name], MD;  Location: [facility] ENDOSCOPY;  Service: Cardiopulmonary;  Laterality: Bilateral;   HEMOSTASIS CONTROL  03/26/2021   Procedure: HEMOSTASIS CONTROL;  Surgeon: [name], MD;  Location: [facility];  Service: Cardiopulmonary;;  [place]   VIDEO BRONCHOSCOPY N/A 03/26/2021   Procedure: VIDEO BRONCHOSCOPY WITHOUT FLUORO;  Surgeon: [name], MD;  Location: [facility] ENDOSCOPY;  Service: Cardiopulmonary;  Laterality: N/A;    Allergies: Patient has no known allergies.  Medications: Prior to Admission medications   Medication Sig Start Date End Date Taking? Authorizing Provider  doxycycline (ADOXA) 100 MG tablet Take 1 tablet (100 mg total) by mouth 2 (two) times daily. 04/01/21  Yes [name], MD  metFORMIN (GLUCOPHAGE) 500 MG tablet Take 500 mg by mouth 2 (two) times daily with a meal.   Yes [provider]     History reviewed. No pertinent family history.  Social History   Socioeconomic History   Marital status: Married    Spouse name: Not on file   Number of children: Not on file   Years of education: Not on file   Highest education level: Not on file  Occupational History   Not on file  Tobacco Use   Smoking status: Never    Passive exposure: Never   Smokeless tobacco: Never  Vaping Use   Vaping Use: Never used  Substance and Sexual Activity   Alcohol use: Not Currently    Alcohol/week: 1.0 standard drink  Types: 1 Glasses of wine per week    Comment: occassiona   Drug use: Never   Sexual activity: Yes    Birth control/protection: Other-see comments    Comment: husb vasectomy  Other Topics Concern   Not on file  Social History Narrative   Not on  file   Social Determinants of Health   Financial Resource Strain: Not on file  Food Insecurity: Not on file  Transportation Needs: Not on file  Physical Activity: Not on file  Stress: Not on file  Social Connections: Not on file      Review of Systems: A 12 point ROS discussed and pertinent positives are indicated in the HPI above.  All other systems are negative.  Review of Systems  Constitutional:  Positive for appetite change. Negative for chills and fever.  HENT:  Negative for nosebleeds.   Respiratory:  Negative for cough and shortness of breath.   Cardiovascular:  Negative for chest pain, palpitations and leg swelling.  Gastrointestinal:  Negative for abdominal pain, blood in stool, diarrhea, nausea and vomiting.  Genitourinary:  Negative for hematuria.  Neurological:  Negative for dizziness, light-headedness and headaches.   Vital Signs: BP 121/81   Pulse 92   Temp 98.2 F (36.8 C) (Oral)   Resp 16   Ht 5\' 5"  (1.651 m)   Wt 225 lb (102.1 kg)   LMP 03/28/2021   SpO2 99%   BMI 37.44 kg/m   Physical Exam Vitals reviewed.  Constitutional:      Appearance: Normal appearance. She is not ill-appearing.  HENT:     Head: Normocephalic and atraumatic.     Mouth/Throat:     Mouth: Mucous membranes are moist.     Pharynx: Oropharynx is clear.  Eyes:     Pupils: Pupils are equal, round, and reactive to light.  Cardiovascular:     Rate and Rhythm: Normal rate and regular rhythm.     Pulses: Normal pulses.     Heart sounds: No murmur heard.   No gallop.  Pulmonary:     Effort: Pulmonary effort is normal. No respiratory distress.     Breath sounds: Normal breath sounds. No stridor. No wheezing, rhonchi or rales.  Abdominal:     General: There is no distension.     Palpations: Abdomen is soft.     Tenderness: There is no abdominal tenderness. There is no guarding.  Musculoskeletal:     Right lower leg: No edema.     Left lower leg: No edema.  Skin:    General:  Skin is warm and dry.  Neurological:     Mental Status: She is alert and oriented to person, place, and time.  Psychiatric:        Mood and Affect: Mood normal.        Behavior: Behavior normal.        Thought Content: Thought content normal.        Judgment: Judgment normal.    Imaging: MR LIVER W WO CONTRAST  Result Date: 03/22/2021 CLINICAL DATA:  Characterize liver lesions, possible metastases EXAM: MRI ABDOMEN WITHOUT AND WITH CONTRAST TECHNIQUE: Multiplanar multisequence MR imaging of the abdomen was performed both before and after the administration of intravenous contrast. CONTRAST:  57mL GADAVIST GADOBUTROL 1 MMOL/ML IV SOLN COMPARISON:  CT chest, 02/25/2021 FINDINGS: Lower chest: No acute findings. Left hilar lung mass is partially imaged on select sequences (series 30, image 61). Hepatobiliary: There are innumerable arterially hyperenhancing lesions of varying sizes throughout  the liver, largest in the posterior left lobe measuring 5.0 x 4.9 cm (series 20, image 37). Additional index lesion of the anterior right lobe measures 2.9 x 2.7 cm (series 20, image 34). No gallstones. No biliary ductal dilatation. Pancreas: No mass, inflammatory changes, or other parenchymal abnormality identified. Spleen:  Within normal limits in size and appearance. Adrenals/Urinary Tract: No masses identified. No evidence of hydronephrosis. Stomach/Bowel: Visualized portions within the abdomen are unremarkable. Vascular/Lymphatic: No pathologically enlarged lymph nodes identified. No abdominal aortic aneurysm demonstrated. Other:  None. Musculoskeletal: No suspicious bone lesions identified. IMPRESSION: 1. Innumerable arterially hyperenhancing lesions of varying sizes throughout the liver, consistent with hepatic metastatic disease. 2. No lymphadenopathy or other evidence of metastatic disease in the abdomen. 3. Left hilar mass is partially imaged and better assessed by prior CT of the chest. Electronically Signed    By: Delanna Ahmadi M.D.   On: 03/22/2021 15:07    Labs:  CBC: Recent Labs    04/11/21 0606  WBC 11.9*  HGB 14.2  HCT 40.3  PLT 470*    COAGS: Recent Labs    04/11/21 0606  INR 1.0    BMP: No results for input(s): NA, K, CL, CO2, GLUCOSE, BUN, CALCIUM, CREATININE, GFRNONAA, GFRAA in the last 8760 hours.  Invalid input(s): CMP  LIVER FUNCTION TESTS: No results for input(s): BILITOT, AST, ALT, ALKPHOS, PROT, ALBUMIN in the last 8760 hours.  TUMOR MARKERS: No results for input(s): AFPTM, CEA, CA199, CHROMGRNA in the last 8760 hours.  Assessment and Plan: History of diabetes and tobacco abuse.  Patient presented to ED on 02/25/2021 complaining of dysphagia and difficulty managing secretions.  CT chest and neck done at that time resulted with left hilar mass and multiple hepatic lesions.  Patient underwent bronchoscopy on 03/26/2021 that resulted with low-grade neoplasm with neuroendocrine differentiation. Patient had MRI 03/22/2021 that resulted with hepatic lesions of varying size consistent with metastatic disease.  Dr. Court Joy has referred patient to IR today for liver lesion biopsy to rule out malignancy.  He is also requesting cytology, gram stain, culture fungus without smear.  Patient has PET scan scheduled 04/15/2021.   Patient observed to be resting on stretcher watching TV.  She is alert and oriented, calm and pleasant.  She is in no distress. Patient states she is NPO per order. Today's labs are within within normal limits.  Risks and benefits of liver lesion biopsy was discussed with the patient and/or patient's family including, but not limited to bleeding, infection, damage to adjacent structures or low yield requiring additional tests.  All of the questions were answered and there is agreement to proceed.  Consent signed and in chart.   Thank you for this interesting consult.  I greatly enjoyed meeting Rayme D Buist and look forward to participating in  their care.  A copy of this report was sent to the requesting provider on this date.  Electronically Signed: Tyson Alias, NP 04/11/2021, 7:30 AM   I spent a total of 30 minutes in face to face in clinical consultation, greater than 50% of which was counseling/coordinating care for liver lesion biopsy.

## 2021-04-12 LAB — SURGICAL PATHOLOGY

## 2021-04-15 ENCOUNTER — Other Ambulatory Visit: Payer: Self-pay

## 2021-04-15 ENCOUNTER — Ambulatory Visit (HOSPITAL_COMMUNITY)
Admission: RE | Admit: 2021-04-15 | Discharge: 2021-04-15 | Disposition: A | Discharge status: Home / Self Care | Payer: Medicaid Other | Source: Ambulatory Visit | Attending: Emergency Medicine | Admitting: Emergency Medicine

## 2021-04-15 DIAGNOSIS — R918 Other nonspecific abnormal finding of lung field: Secondary | ICD-10-CM | POA: Insufficient documentation

## 2021-04-15 LAB — GLUCOSE, CAPILLARY: Glucose-Capillary: 195 mg/dL — ABNORMAL HIGH (ref 70–99)

## 2021-04-15 IMAGING — PT NM PET TUM IMG INITIAL (PI) SKULL BASE T - THIGH
1 of 8 series · 1 of 25 positions shown · non-contrast
Comparison: MRI [DATE], CT [DATE]

CLINICAL DATA: Initial treatment strategy for hilar mass. Lung mass
with neuroendocrine differentiation. Liver lesions consistent with
low-grade neuroendocrine tumor of lung primary.

EXAM:
NUCLEAR MEDICINE PET SKULL BASE TO THIGH
TECHNIQUE: 11.2 mCi F-18 FDG was injected intravenously. Full-ring PET imaging
was performed from the skull base to thigh after the radiotracer. CT
data was obtained and used for attenuation correction and anatomic
localization.
Fasting blood glucose: 195 mg/dl

[Series 4: ct sk_thigh 5.0 hd_fov · axial · 5.0mm · 1.52mm/px · 1 of 236 slices shown]
[im 1/236  brain]
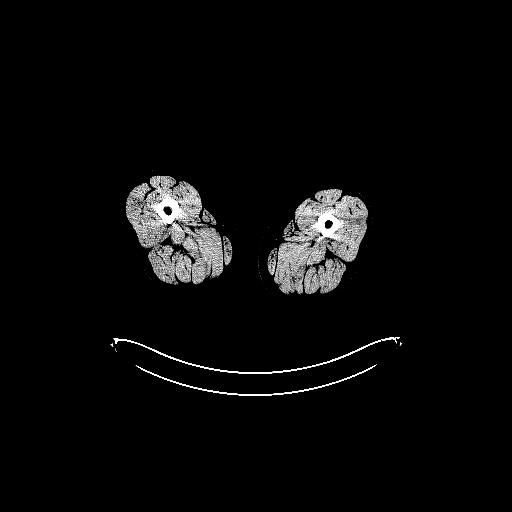

[1 of 25 positions shown; findings below may reference images not displayed]

FINDINGS: Mediastinal blood pool activity: SUV max

Liver activity: SUV max NA

NECK: No hypermetabolic lymph nodes in the neck.

Incidental CT findings: none

CHEST: LEFT hilar soft tissue mass with several internal
calcifications measuring 3.7 cm on image 65/4 has very low metabolic
activity SUV max equal 2.9.

No additional pulmonary nodules.  No mediastinal adenopathy.

Incidental CT findings: none

ABDOMEN/PELVIS: Multiple relative high-density lesions are seen
within the liver on the noncontrast CT portion of the PET-CT exam.
These lesions have no significant metabolic activity above
background liver activity.

No hypermetabolic lesion in pancreas. No hypermetabolic lymph nodes
in the abdomen pelvis

Incidental CT findings: none

SKELETON: No focal hypermetabolic activity to suggest skeletal
metastasis.

Incidental CT findings: No lytic or sclerotic lesions on CT portion
exam.
IMPRESSION: 1. Low metabolic activity associated with the LEFT hilar mass
consistent with well differentiated neuroendocrine tumor.
2. No significant metabolic activity associated with the multifocal
hepatic metastasis consistent with well differentiated
neuroendocrine tumor.
3. No evidence of metastatic disease identified outside of the liver
by FDG PET imaging. Consider DOTATATE PET imaging for
characterization of well differentiated neuroendocrine tumor.

## 2021-04-15 MED ORDER — FLUDEOXYGLUCOSE F - 18 (FDG) INJECTION
11.0000 | Freq: Once | INTRAVENOUS | Status: AC | PRN
  Administered 2021-04-15: 11.21 via INTRAVENOUS

## 2021-04-15 NOTE — Progress Notes (Signed)
[place] Telephone:([phone]   Fax:([phone]) [phone]  CONSULT NOTE  REFERRING PHYSICIAN: Dr. Lamonte Sakai   REASON FOR CONSULTATION:  Metastatic neuroendocrine carcinoma   HPI Katherine Washington is a 35 y.o. female with no significant past medical history except for diabetes is referred to the clinic for newly diagnosed metastatic neuroendocrine carcinoma.   The patient's evaluation began in September 2022 when she was having right sided dental pain and left sided facial pain. She started having trouble managing secretions and dysphagia and was seen in the ER at 02/25/21 due to concern for abscess. She was seen at [facility] in [place]. She had a CT neck which showed left pulmonary hilar mass, possible malignancy.   This prompted a CT chest which showed left hilar mass which encases and obstructs the superior segment left lower lobe bronchi. Primary differential considerations include bronchogenic carcinoma versus metastatic adenopathy. Multiple enhancing liver lesions were visualized and suspicious for metastatic disease. There was a focal area of sclerosis within the right side of T12 is noted which is nonspecific but cannot exclude osseous metastasis.   The patient was then see by Dr. Lamonte Sakai on 03/19/21. Dr. Lamonte Sakai arranged for MRI of the liver to further characterize the liver lesions which showed innumerable arterially hyperenhancing lesions of varying sizes throughout the liver, consistent with hepatic metastatic disease. No lymphadenopathy or other evidence of metastatic disease in the abdomen.   He arranged for a bronchoscopy on 03/26/21 of the left hilar mass. The final pathology  was consistent with low grade neuroendocrine carcinoma.   The patient underwent a ultrasound guided core biopsy of the liver lesion which was also consistent with metastatic disease.   She underwent a PET scan on 04/15/21 which showed low metabolic activity associated with the left hilar mass consistent  with well differentiated neuroendocrine tumor.  There is no significant metastatic disease identified outside of the liver by FDG imaging and radiology recommended a dotatate PET scan for further characterization of well differentiated neuroendocrine tumor.  There is no significant metabolic activity associate with the multifocal hepatic metastasis.  Overall, the patient is feeling well today.  She is asymptomatic related to her malignancy.  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  She denies any fever, chills, night sweats, or unexplained weight loss.  She denies any chest pain, hemoptysis, or cough.  Denies any abdominal pain, nausea, vomiting, diarrhea, or constipation.  Denies any palpitations.  Denies any flushing or wheezing.  Patient's family history consists of a mother who has some mental health disorders including bipolar disorder and major depressive disorder.  She also has fibromyalgia and was undergoing IVIG infusions for an unknown medical condition.  The patient's father was prediabetic.  The patient has a younger sibling with no significant medical disorders.  The patient's maternal grandfather had metastatic lung cancer which was thought to be secondary to environmental exposure as he was a Building control surveyor.  The patient's maternal grandmother had diabetes, osteoporosis, and heart disease.  The patient works as a Scientist, water quality.  She is married and accompanied by her husband Katherine Washington today.  She has 2 children 13 and 16.  The patient is currently on her menstrual period.  She states that her and her husband are not planning on having anymore children.  The patient is a former smoker having smoked approximately 4 to 5 years estimating that she smokes half a pack of cigarettes per day.  She quit smoking 2012.  The patient averages 1 to 2 glasses of alcohol per month.  Denies any history of substance  abuse.    HPI  Past Medical History:  Diagnosis Date   Diabetes mellitus without complication [facility])     Past Surgical History:  Procedure Laterality Date   BRONCHIAL BIOPSY  03/26/2021   Procedure: BRONCHIAL BIOPSIES;  Surgeon: [name], MD;  Location: [facility];  Service: Cardiopulmonary;;   BRONCHIAL BRUSHINGS  03/26/2021   Procedure: BRONCHIAL BRUSHINGS;  Surgeon: [name], MD;  Location: [facility];  Service: Cardiopulmonary;;   BRONCHIAL NEEDLE ASPIRATION BIOPSY  03/26/2021   Procedure: BRONCHIAL NEEDLE ASPIRATION BIOPSIES;  Surgeon: [name], MD;  Location: [facility];  Service: Cardiopulmonary;;   BRONCHIAL WASHINGS  03/26/2021   Procedure: BRONCHIAL WASHINGS;  Surgeon: [name], MD;  Location: [facility];  Service: Cardiopulmonary;;   ENDOBRONCHIAL ULTRASOUND Bilateral 03/26/2021   Procedure: ENDOBRONCHIAL ULTRASOUND;  Surgeon: [name], MD;  Location: [facility];  Service: Cardiopulmonary;  Laterality: Bilateral;   HEMOSTASIS CONTROL  03/26/2021   Procedure: HEMOSTASIS CONTROL;  Surgeon: [name], MD;  Location: [facility];  Service: Cardiopulmonary;;  [place]   VIDEO BRONCHOSCOPY N/A 03/26/2021   Procedure: VIDEO BRONCHOSCOPY WITHOUT FLUORO;  Surgeon: [name], MD;  Location: [facility];  Service: Cardiopulmonary;  Laterality: N/A;    No family history on file.  Social History Social History   Tobacco Use   Smoking status: Former    Packs/day: 0.50    Years: 5.00    Pack years: 2.50    Types: Cigarettes    Passive exposure: Never   Smokeless tobacco: Never  Vaping Use   Vaping Use: Never used  Substance Use Topics   Alcohol use: Not Currently    Alcohol/week: 1.0 standard drink    Types: 1 Glasses of wine per week    Comment: occassiona   Drug use: Never    No Known Allergies  Current Outpatient Medications  Medication Sig Dispense Refill   metFORMIN (GLUCOPHAGE) 500 MG tablet Take  500 mg by mouth 2 (two) times daily with a meal.     No current facility-administered medications for this visit.    REVIEW OF SYSTEMS:   Review of Systems  Constitutional: Negative for appetite change, chills, fatigue, fever and unexpected weight change.  HENT: Negative for mouth sores, nosebleeds, sore throat and trouble swallowing.   Eyes: Negative for eye problems and icterus.  Respiratory: Negative for cough, hemoptysis, shortness of breath and wheezing.   Cardiovascular: Negative for chest pain and leg swelling.  Gastrointestinal: Negative for abdominal pain, constipation, diarrhea, nausea and vomiting.  Genitourinary: Negative for bladder incontinence, difficulty urinating, dysuria, frequency and hematuria.   Musculoskeletal: Negative for back pain, gait problem, neck pain and neck stiffness.  Skin: Negative for itching and rash.  Neurological: Negative for dizziness, extremity weakness, gait problem, headaches, light-headedness and seizures.  Hematological: Negative for adenopathy. Does not bruise/bleed easily.  Psychiatric/Behavioral: Negative for confusion, depression and sleep disturbance. The patient is not nervous/anxious.     PHYSICAL EXAMINATION:  Blood pressure 125/87, pulse 78, temperature (!) 97.5 F (36.4 C), resp. rate 20, weight 225 lb 1.6 oz (102.1 kg), last menstrual period 03/28/2021, SpO2 100 %.  ECOG PERFORMANCE STATUS: 1  Physical Exam  Constitutional: Oriented to person, place, and time and well-developed, well-nourished, and in no distress.  HENT:  Head: Normocephalic and atraumatic.  Mouth/Throat: Oropharynx is clear and moist.  No oropharyngeal exudate.  Eyes: Conjunctivae are normal. Right eye exhibits no discharge. Left eye exhibits no discharge. No scleral icterus.  Neck: Normal range of motion. Neck supple.  Cardiovascular: Normal rate, regular rhythm, normal heart sounds and intact distal pulses.   Pulmonary/Chest: Effort normal and breath sounds  normal. No respiratory distress. No wheezes. No rales.  Abdominal: Soft. Bowel sounds are normal. Exhibits no distension and no mass. There is no tenderness.  Musculoskeletal: Normal range of motion. Exhibits no edema.  Lymphadenopathy:    No cervical adenopathy.  Neurological: Alert and oriented to person, place, and time. Exhibits normal muscle tone. Gait normal. Coordination normal.  Skin: Skin is warm and dry. No rash noted. Not diaphoretic. No erythema. No pallor.  Psychiatric: Mood, memory and judgment normal.  Vitals reviewed.  LABORATORY DATA: Lab Results  Component Value Date   WBC 7.3 04/17/2021   HGB 13.3 04/17/2021   HCT 37.4 04/17/2021   MCV 79.7 (L) 04/17/2021   PLT 356 04/17/2021      Chemistry      Component Value Date/Time   NA 138 04/17/2021 0930   K 4.3 04/17/2021 0930   CL 105 04/17/2021 0930   CO2 25 04/17/2021 0930   BUN 10 04/17/2021 0930   CREATININE 0.75 04/17/2021 0930      Component Value Date/Time   CALCIUM 9.0 04/17/2021 0930   ALKPHOS 64 04/17/2021 0930   AST 19 04/17/2021 0930   ALT 26 04/17/2021 0930   BILITOT 0.7 04/17/2021 0930       RADIOGRAPHIC STUDIES: MR LIVER W WO CONTRAST  Result Date: 03/22/2021 CLINICAL DATA:  Characterize liver lesions, possible metastases EXAM: MRI ABDOMEN WITHOUT AND WITH CONTRAST TECHNIQUE: Multiplanar multisequence MR imaging of the abdomen was performed both before and after the administration of intravenous contrast. CONTRAST:  57mL GADAVIST GADOBUTROL 1 MMOL/ML IV SOLN COMPARISON:  CT chest, 02/25/2021 FINDINGS: Lower chest: No acute findings. Left hilar lung mass is partially imaged on select sequences (series 30, image 61). Hepatobiliary: There are innumerable arterially hyperenhancing lesions of varying sizes throughout the liver, largest in the posterior left lobe measuring 5.0 x 4.9 cm (series 20, image 37). Additional index lesion of the anterior right lobe measures 2.9 x 2.7 cm (series 20, image 34).  No gallstones. No biliary ductal dilatation. Pancreas: No mass, inflammatory changes, or other parenchymal abnormality identified. Spleen:  Within normal limits in size and appearance. Adrenals/Urinary Tract: No masses identified. No evidence of hydronephrosis. Stomach/Bowel: Visualized portions within the abdomen are unremarkable. Vascular/Lymphatic: No pathologically enlarged lymph nodes identified. No abdominal aortic aneurysm demonstrated. Other:  None. Musculoskeletal: No suspicious bone lesions identified. IMPRESSION: 1. Innumerable arterially hyperenhancing lesions of varying sizes throughout the liver, consistent with hepatic metastatic disease. 2. No lymphadenopathy or other evidence of metastatic disease in the abdomen. 3. Left hilar mass is partially imaged and better assessed by prior CT of the chest. Electronically Signed   By: Delanna Ahmadi M.D.   On: 03/22/2021 15:07   NM PET Image Initial (PI) Skull Base To Thigh  Result Date: 04/15/2021 CLINICAL DATA:  Initial treatment strategy for hilar mass. Lung mass with neuroendocrine differentiation. Liver lesions consistent with low-grade neuroendocrine tumor of lung primary. EXAM: NUCLEAR MEDICINE PET SKULL BASE TO THIGH TECHNIQUE: 11.2 mCi F-18 FDG was injected intravenously. Full-ring PET imaging was performed from the skull base to thigh after the radiotracer. CT data was obtained and used for attenuation correction and anatomic localization. Fasting blood glucose: 195 mg/dl COMPARISON:  MRI 03/21/2021, CT 02/25/2021 FINDINGS: Mediastinal blood pool activity: SUV max 2.4 Liver activity: SUV max NA NECK: No hypermetabolic lymph nodes in the neck. Incidental CT findings: none CHEST: LEFT hilar soft tissue mass with several internal calcifications measuring 3.7 cm on image 65/4 has very low metabolic activity SUV max equal 2.9. No additional pulmonary nodules.  No mediastinal adenopathy. Incidental CT findings: none ABDOMEN/PELVIS: Multiple relative  high-density lesions are seen within the liver on the noncontrast CT portion of the PET-CT exam. These lesions have no significant metabolic activity above background liver activity. No hypermetabolic lesion in pancreas. No hypermetabolic lymph nodes in the abdomen pelvis Incidental CT findings: none SKELETON: No focal hypermetabolic activity to suggest skeletal metastasis. Incidental CT findings: No lytic or sclerotic lesions on CT portion exam. IMPRESSION: 1. Low metabolic activity associated with the LEFT hilar mass consistent with well differentiated neuroendocrine tumor. 2. No significant metabolic activity associated with the multifocal hepatic metastasis consistent with well differentiated neuroendocrine tumor. 3. No evidence of metastatic disease identified outside of the liver by FDG PET imaging. Consider DOTATATE PET imaging for characterization of well differentiated neuroendocrine tumor. Electronically Signed   By: Suzy Bouchard M.D.   On: 04/15/2021 14:46   [place] CORE BIOPSY (LIVER)  Result Date: 04/11/2021 INDICATION: 35 year old female with pulmonary low-grade neuroendocrine tumor and multiple liver lesions concerning for metastatic disease. She presents for ultrasound-guided core biopsy of the same. EXAM: ULTRASOUND BIOPSY CORE LIVER MEDICATIONS: None. ANESTHESIA/SEDATION: Moderate (conscious) sedation was employed during this procedure. A total of Versed 2 mg and Fentanyl 75 mcg was administered intravenously. Moderate Sedation Time: 10 minutes. The patient's level of consciousness and vital signs were monitored continuously by radiology nursing throughout the procedure under my direct supervision. FLUOROSCOPY TIME:  None. COMPLICATIONS: None immediate. PROCEDURE: Informed written consent was obtained from the patient after a thorough discussion of the procedural risks, benefits and alternatives. All questions were addressed. Maximal Sterile Barrier Technique was utilized including caps, mask,  sterile gowns, sterile gloves, sterile drape, hand hygiene and skin antiseptic. A timeout was performed prior to the initiation of the procedure. The right upper quadrant was interrogated with ultrasound. New the liver background is echogenic and slightly coarsened consistent with hepatic steatosis. Multiple circumscribed hypoechoic solid lesions are identified scattered throughout the liver. A suitable skin entry site was selected and marked. The region was sterilely prepped and draped in the standard fashion using chlorhexidine skin prep. Local anesthesia was attained by infiltration with 1% lidocaine. A small dermatotomy was made. Under real-time sonographic guidance, a 17 gauge introducer needle was advanced into the margin of the mass. Multiple 18 gauge biopsies were then coaxially obtained using the BioPince automated biopsy device. Biopsy samples were placed in formalin and delivered to pathology for further analysis. As a 17 gauge introducer needle was removed, the biopsy tract was embolized with a Gel-Foam slurry. Post biopsy ultrasound imaging demonstrates no evidence of active hemorrhage or perihepatic hematoma. The patient tolerated the procedure well. IMPRESSION: Technically successful ultrasound-guided core biopsy of hepatic lesion. Background hepatic steatosis. Electronically Signed   By: Jacqulynn Cadet M.D.   On: 04/11/2021 10:06    ASSESSMENT: This is a very pleasant 35 year old caucasian female with metastatic neuroendocrine carcinoma. She presented with a left hilar mass and metastatic disease to the liver. She was diagnosed in October 2022.   Dr. Julien Nordmann had a lengthly discussion with the patient today about her current condition and treatment options.  Dr. Julien Nordmann discussed that he  would like to refer the patient to interventional radiologist, Dr. Leonia Reeves to see if the patient is a candidate for Lutathera treatment.  Additionally, Dr. Julien Nordmann discussed that the patient will likely require  somatostatin injections.   I will place a referral to Dr. Leonia Reeves in interventional radiology to see if the patient is a candidate for this treatment.  Dr. Julien Nordmann does not recommend scheduling octreotide/metastatic injections at this time and will wait till the patient if seen by Dr. Ilda Foil.  The meantime, Dr. Julien Nordmann recommends a dotatate PET scan to stage her disease as she has well differentiated neuroendocrine tumor.  Patient was given handouts on her AVS related to carcinoid tumor, Lutathera treatment, and octreotide injections.  We will see the patient back for follow-up visit with lab work in 1 month and for more detailed discussion about recommended treatment options.  I will arrange for the patient to have a 24-hour urine 5-HIAA performed.  I will also arrange for her to have chromogranin a testing performed at her next lab draw in 1 month.  Briefly discussed fertility with the patient.  The patient states that she is finished having children and she is not interested in being referred to a fertility clinic to discuss ways to preserve fertility/family-planning. She is currently on her menstrual period.   The patient voices understanding of current disease status and treatment options and is in agreement with the current care plan.  All questions were answered. The patient knows to call the clinic with any problems, questions or concerns. We can certainly see the patient much sooner if necessary.  Thank you so much for allowing me to participate in the care of [place]. I will continue to follow up the patient with you and assist in her care.   Disclaimer: This note was dictated with voice recognition software. Similar sounding words can inadvertently be transcribed and may not be corrected upon review.   Mohsin Crum L Kymberlee Viger April 17, 2021, 12:06 PM  ADDENDUM: Hematology/Oncology Attending: I had a face-to-face encounter with the patient today.  I reviewed her  record, lab, scan and recommended her care plan. This is a very pleasant 35 years old white female with no concerning medical history except for history of diabetes.  The patient mentioned that in September 2022 she had right-sided dental pain and left-sided facial pain and tenderness.  She presented to the emergency department at [facility] emergency department in [facility].  She had CT scan of the neck which showed incidental finding of pulmonary hilar mass suspicious for malignancy.  This was followed by CT scan of the chest that showed the left hilar mass with encasement and obstruction of the superior segment left lower lobe bronchi suspicious for primary bronchogenic carcinoma with metastatic adenopathy.  The scan of the chest also showed multiple enhancing liver lesions suspicious for liver metastasis.  There was also a focal area of sclerosis within the right side of T12 that is nonspecific but osseous metastasis could not be excluded.  The patient was referred to Dr. Lamonte Sakai and MRI of the liver was performed on March 21, 2021 and it showed innumerable arterially hyperenhancing lesions of varying size throughout the liver consistent with hepatic metastatic disease.  There was no lymphadenopathy or other evidence of metastatic disease in the abdomen.  The MRI also showed the left hilar mass was partially imaged.  On March 26, 2021 the patient underwent bronchoscopy with endobronchial biopsy of the left lower lobe superior segment and  the final pathology was consistent with neoplasm with neuroendocrine differentiation. There is bronchial mucosa with a small focus of neoplasm characterized  by cells with moderate cytoplasm and round to oval nuclei consistent  with epithelioid neoplasm.  Immunohistochemistry shows positivity with  CD56, chromogranin and synaptophysin.  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   On April 11, 2021 the patient underwent ultrasound-guided core biopsy of one of the hepatic lesions by interventional radiology. The final pathology ([id number]) showed metastatic tumor to the liver consistent with the patient his clinical history of primary low-grade neuroendocrine tumor of the lung. The patient also had a PET scan on April 15, 2021 and that showed no metabolic activity associated with the left hilar mass consistent with well-differentiated neuroendocrine tumor.  There was no significant metabolic activity associated with the multifocal hepatic metastasis consistent with well-differentiated neuroendocrine tumor and no evidence of metastatic disease identified outside of the liver by FDG PET imaging. Dr. Lamonte Sakai kindly referred the patient to me today for evaluation and recommendation regarding her condition. When seen today she is feeling fine with no concerning complaints except for mild cough. I had a lengthy discussion with the patient and her husband today about her current disease stage, prognosis and treatment options. I explained to the patient that she has low-grade neuroendocrine carcinoma but unfortunately it is a stage IV with the liver metastasis. I recommended for the patient to have the dotatate PET scan for further evaluation of her disease.  At the patient has activity on the upcoming PET scan she may benefit from treatment with Lutathera by nuclear medicine. Will refer the patient to see nuclear medicine specialist for discussion of this option. Will also consider The patient for treatment with octreotide for control of her symptoms and also has adjuvant treatment with the nuclear medicine. Will order several studies today for evaluation of her condition including 24-hour urine for 5-HIAA as well as serum chromogranin. We will see the patient back for follow-up visit in 1 months for evaluation and more detailed discussion of her treatment after the evaluation by  nuclear medicine as well as the dotatate PET scan. The patient was advised to call immediately if she has any other concerning symptoms in the interval.  The total time spent in the appointment was 60 minutes. Disclaimer: This note was dictated with voice recognition software. Similar sounding words can inadvertently be transcribed and may be missed upon review. Eilleen Kempf, MD .t

## 2021-04-16 ENCOUNTER — Ambulatory Visit (INDEPENDENT_AMBULATORY_CARE_PROVIDER_SITE_OTHER): Payer: Self-pay | Admitting: Emergency Medicine

## 2021-04-16 ENCOUNTER — Encounter: Payer: Self-pay | Admitting: Emergency Medicine

## 2021-04-16 DIAGNOSIS — C7A090 Malignant carcinoid tumor of the bronchus and lung: Secondary | ICD-10-CM | POA: Insufficient documentation

## 2021-04-16 NOTE — Assessment & Plan Note (Signed)
We reviewed her PET scan, pathology results including her hepatic biopsies.  Discussed the probable plan of chemotherapy.  She may at some point be a candidate for resection depending on how her lesions respond.  For now she will follow-up at the cancer center.  I will see her as needed.

## 2021-04-16 NOTE — Patient Instructions (Signed)
We reviewed your pathology results, PET scan today. Please follow-up with oncology on 04/17/2021 as planned. Follow Dr. Lamonte Sakai if needed for any problems, questions.

## 2021-04-16 NOTE — Progress Notes (Signed)
   Subjective:    Patient ID: Katherine Washington, female    DOB: Nov 16, 1985, 35 y.o.   MRN: [id number]  HPI 35 year old former smoker (3-4 pack years) with a history of diabetes.  She has been dealing with right-sided dental pain and left facial pain in mid September and was started on Augmentin for possible dental abscess.  She involves some dysphagia, difficulty managing her secretions and was seen in the ED.  CT neck and then CT scan of the chest were performed 9/19.  The CT scan of the neck was negative for mass or posterior pharyngeal obstruction.  She did have a large right jugular nodes, mucosal thickening along the floor the right maxillary sinus question odontogenic involving the first right molar.  Also partially revealed a left hilar soft tissue density which prompted the CT chest.  She has been well overall - has had some fatigue over last 6-8 months. Has had some trouble w breath control when singing. She does not cough, no CP. No sputum or hemoptysis. She noticed some chest tightness and anxiety since the lesion was found.   CT scan of the chest 02/25/2021 reviewed by me shows left hilar soft tissue mass that encases and obstructs the superior segmental left lower lobe airway.  There is some calcification in the lesion.  Also noted were multiple enhancing hepatic lesions.  Entire clinical picture concerning for possible malignancy.   ROV 04/16/21 --this a follow-up visit for 35 year old woman with history of diabetes, minimal tobacco history.  We spuriously found a left hilar soft tissue mass on first neck and then chest CT imaging 02/25/2021.  She underwent bronchoscopy 03/26/2021 and biopsies revealed neuroendocrine tumor.  She subsequently had biopsy of liver abnormality 04/11/2021.  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 No resp sx, no cough, no blood. Feeling at her baseline.    Review of Systems As per HPI     Objective:   Physical  Exam Vitals:   04/16/21 0910  BP: 124/78  Pulse: 95  Temp: 98.1 F (36.7 C)  TempSrc: Oral  SpO2: 98%  Weight: 224 lb 6.4 oz (101.8 kg)  Height: 5\' 4"  (1.626 m)   Gen: Pleasant, well-nourished, in no distress,  normal affect  ENT: No lesions,  mouth clear,  oropharynx clear, no postnasal drip  Neck: No JVD, no stridor  Lungs: No use of accessory muscles, no crackles or wheezing on normal respiration, no wheeze on forced expiration  Cardiovascular: RRR, heart sounds normal, no murmur or gallops, no peripheral edema  Musculoskeletal: No deformities, no cyanosis or clubbing  Neuro: alert, awake, non focal  Skin: Warm, no lesions or rash     Assessment & Plan:  Carcinoid tumor, bronchus, lung, malignant ([place]) We reviewed her PET scan, pathology results including her hepatic biopsies.  Discussed the probable plan of chemotherapy.  She may at some point be a candidate for resection depending on how her lesions respond.  For now she will follow-up at the cancer center.  I will see her as needed.   Baltazar Apo, MD, PhD 04/16/2021, 9:31 AM [facility] [phone] or if no answer before 7:00PM call [phone] For any issues after 7:00PM please call eLink [phone]

## 2021-04-17 ENCOUNTER — Encounter: Payer: Self-pay | Admitting: *Deleted

## 2021-04-17 ENCOUNTER — Inpatient Hospital Stay: Payer: Self-pay

## 2021-04-17 ENCOUNTER — Other Ambulatory Visit: Payer: Self-pay

## 2021-04-17 ENCOUNTER — Inpatient Hospital Stay: Payer: Medicaid Other | Attending: Physician Assistant | Admitting: Physician Assistant

## 2021-04-17 VITALS — BP 125/87 | HR 78 | Temp 97.5°F | Resp 20 | Wt 225.1 lb

## 2021-04-17 DIAGNOSIS — Z87891 Personal history of nicotine dependence: Secondary | ICD-10-CM

## 2021-04-17 DIAGNOSIS — C7A8 Other malignant neuroendocrine tumors: Secondary | ICD-10-CM | POA: Insufficient documentation

## 2021-04-17 DIAGNOSIS — C7B02 Secondary carcinoid tumors of liver: Secondary | ICD-10-CM

## 2021-04-17 DIAGNOSIS — R918 Other nonspecific abnormal finding of lung field: Secondary | ICD-10-CM

## 2021-04-17 DIAGNOSIS — C7A090 Malignant carcinoid tumor of the bronchus and lung: Secondary | ICD-10-CM

## 2021-04-17 LAB — CMP (CANCER CENTER ONLY)
ALT: 26 U/L (ref 0–44)
AST: 19 U/L (ref 15–41)
Albumin: 3.9 g/dL (ref 3.5–5.0)
Alkaline Phosphatase: 64 U/L (ref 38–126)
Anion gap: 8 (ref 5–15)
BUN: 10 mg/dL (ref 6–20)
CO2: 25 mmol/L (ref 22–32)
Calcium: 9 mg/dL (ref 8.9–10.3)
Chloride: 105 mmol/L (ref 98–111)
Creatinine: 0.75 mg/dL (ref 0.44–1.00)
GFR, Estimated: >60 mL/min (ref >60)
Glucose, Bld: 165 mg/dL — ABNORMAL HIGH (ref 70–99)
Potassium: 4.3 mmol/L (ref 3.5–5.1)
Sodium: 138 mmol/L (ref 135–145)
Total Bilirubin: 0.7 mg/dL (ref 0.3–1.2)
Total Protein: 6.9 g/dL (ref 6.5–8.1)

## 2021-04-17 LAB — CBC WITH DIFFERENTIAL (CANCER CENTER ONLY)
Abs Immature Granulocytes: 0.01 10*3/uL (ref 0.00–0.07)
Basophils Absolute: 0 10*3/uL (ref 0.0–0.1)
Basophils Relative: 1 %
Eosinophils Absolute: 0.2 10*3/uL (ref 0.0–0.5)
Eosinophils Relative: 2 %
HCT: 37.4 % (ref 36.0–46.0)
Hemoglobin: 13.3 g/dL (ref 12.0–15.0)
Immature Granulocytes: 0 %
Lymphocytes Relative: 33 %
Lymphs Abs: 2.5 10*3/uL (ref 0.7–4.0)
MCH: 28.4 pg (ref 26.0–34.0)
MCHC: 35.6 g/dL (ref 30.0–36.0)
MCV: 79.7 fL — ABNORMAL LOW (ref 80.0–100.0)
Monocytes Absolute: 0.5 10*3/uL (ref 0.1–1.0)
Monocytes Relative: 6 %
Neutro Abs: 4.2 10*3/uL (ref 1.7–7.7)
Neutrophils Relative %: 58 %
Platelet Count: 356 10*3/uL (ref 150–400)
RBC: 4.69 MIL/uL (ref 3.87–5.11)
RDW: 12.7 % (ref 11.5–15.5)
WBC Count: 7.3 10*3/uL (ref 4.0–10.5)
nRBC: 0 % (ref 0.0–0.2)

## 2021-04-17 NOTE — Progress Notes (Signed)
I spoke to Ms. Katherine Washington today at her new patient visit with Dr. Julien Nordmann.  She is newly dx metastatic cancer.  Her plan of care is to get PET Dotatate and refer to Dr. Leonia Reeves.  I messaged Dr. Leonia Reeves with an update on patient referral.

## 2021-04-26 ENCOUNTER — Telehealth: Payer: Self-pay | Admitting: Internal Medicine

## 2021-04-26 LAB — CYTOLOGY - NON PAP

## 2021-04-26 NOTE — Telephone Encounter (Signed)
Sch per 11/10 ibasket,pt aware

## 2021-05-01 LAB — FUNGUS CULTURE WITH STAIN

## 2021-05-01 LAB — FUNGAL ORGANISM REFLEX

## 2021-05-01 LAB — FUNGUS CULTURE RESULT

## 2021-05-06 ENCOUNTER — Ambulatory Visit (HOSPITAL_COMMUNITY): Payer: Medicaid Other

## 2021-05-14 ENCOUNTER — Other Ambulatory Visit: Payer: Medicaid Other

## 2021-05-14 ENCOUNTER — Encounter: Payer: Self-pay | Admitting: *Deleted

## 2021-05-14 ENCOUNTER — Ambulatory Visit: Payer: Medicaid Other | Admitting: Internal Medicine

## 2021-05-14 LAB — ACID FAST CULTURE WITH REFLEXED SENSITIVITIES (MYCOBACTERIA): Acid Fast Culture: NEGATIVE

## 2021-05-14 NOTE — Progress Notes (Signed)
Oncology Nurse Navigator Documentation  Oncology Nurse Navigator Flowsheets 05/14/2021  Abnormal Finding Date 03/22/2021  Confirmed Diagnosis Date 04/11/2021  Diagnosis Status Confirmed Diagnosis Complete  Planned Course of Treatment Targeted Therapy  Phase of Treatment Targeted Therapy  Navigation Complete Date: 05/14/2021  Post Navigation: Continue to Follow Patient? No  Reason Not Navigating Patient: Seeking Care elsewhere  Navigator Location [facility]  Navigator Encounter Type Telephone  Telephone Outgoing Call/Patient had a follow up with Dr. Julien Nordmann today and I planned to catching up with her. She called a few days ago and cancelled the appt. I checked care everywhere and she saw Dr. Angelica Chessman at [facility].  I called patient to see if she needed anything from [place].  She states nothing is needed and she will be followed at [facility].  I asked that she call us back if needed. I will reach out to Barnett Abu the nurse navigator to see if she needs anything from [place].   Patient Visit Type Other  Treatment Phase Pre-Tx/Tx Discussion  Interventions Education;Psycho-Social Support  Acuity Level 2-Minimal Needs (1-2 Barriers Identified)  Education Method Verbal  Time Spent with Patient 30

## 2021-05-15 ENCOUNTER — Encounter: Payer: Self-pay | Admitting: *Deleted

## 2021-05-15 NOTE — Progress Notes (Signed)
I followed up to check to see about patient's appt getting cancelled.  All appts here at [facility] are cancelled per patients request.
